# Patient Record
Sex: Female | Born: 1955 | Race: White | Hispanic: No | Marital: Married | Smoking: Current every day smoker
Health system: Southern US, Community
[De-identification: ages and names within clinical notes are randomized; demographics above are authoritative.]

## PROBLEM LIST (undated history)

## (undated) HISTORY — PX: ABDOMINAL HYSTERECTOMY: SHX81

---

## 2002-11-14 ENCOUNTER — Other Ambulatory Visit: Admission: RE | Admit: 2002-11-14 | Discharge: 2002-11-14 | Payer: Self-pay | Admitting: Family Medicine

## 2004-06-30 ENCOUNTER — Ambulatory Visit: Payer: Self-pay | Admitting: Internal Medicine

## 2006-06-22 ENCOUNTER — Ambulatory Visit: Payer: Self-pay | Admitting: Family Medicine

## 2007-02-01 ENCOUNTER — Encounter: Admission: RE | Admit: 2007-02-01 | Discharge: 2007-02-01 | Payer: Self-pay | Admitting: Family Medicine

## 2007-02-01 ENCOUNTER — Ambulatory Visit: Payer: Self-pay | Admitting: Family Medicine

## 2007-02-01 DIAGNOSIS — R079 Chest pain, unspecified: Secondary | ICD-10-CM | POA: Insufficient documentation

## 2007-02-02 ENCOUNTER — Telehealth: Payer: Self-pay | Admitting: Family Medicine

## 2007-02-02 DIAGNOSIS — R1011 Right upper quadrant pain: Secondary | ICD-10-CM | POA: Insufficient documentation

## 2007-02-06 ENCOUNTER — Telehealth (INDEPENDENT_AMBULATORY_CARE_PROVIDER_SITE_OTHER): Payer: Self-pay | Admitting: *Deleted

## 2007-02-06 ENCOUNTER — Encounter: Admission: RE | Admit: 2007-02-06 | Discharge: 2007-02-06 | Payer: Self-pay | Admitting: Family Medicine

## 2007-03-20 ENCOUNTER — Telehealth (INDEPENDENT_AMBULATORY_CARE_PROVIDER_SITE_OTHER): Payer: Self-pay | Admitting: *Deleted

## 2007-03-22 ENCOUNTER — Ambulatory Visit: Payer: Self-pay | Admitting: Family Medicine

## 2007-03-22 LAB — CONVERTED CEMR LAB
Bilirubin Urine: NEGATIVE
Glucose, Urine, Semiquant: NEGATIVE
Nitrite: NEGATIVE
Protein, U semiquant: NEGATIVE
WBC Urine, dipstick: NEGATIVE

## 2007-03-23 ENCOUNTER — Encounter (INDEPENDENT_AMBULATORY_CARE_PROVIDER_SITE_OTHER): Payer: Self-pay | Admitting: *Deleted

## 2007-03-23 LAB — CONVERTED CEMR LAB
ALT: 21 units/L (ref 0–35)
AST: 18 units/L (ref 0–37)
Albumin: 3.7 g/dL (ref 3.5–5.2)
Bilirubin, Direct: 0.1 mg/dL (ref 0.0–0.3)
CO2: 30 meq/L (ref 19–32)
Cholesterol: 164 mg/dL (ref 0–200)
Creatinine, Ser: 0.8 mg/dL (ref 0.4–1.2)
Direct LDL: 107.2 mg/dL
Eosinophils Relative: 3.4 % (ref 0.0–5.0)
GFR calc Af Amer: 98 mL/min
Glucose, Bld: 76 mg/dL (ref 70–99)
HCT: 41.3 % (ref 36.0–46.0)
HDL: 31.9 mg/dL — ABNORMAL LOW (ref >39.0)
Neutrophils Relative %: 66.1 % (ref 43.0–77.0)
Potassium: 4.2 meq/L (ref 3.5–5.1)
RBC: 4.39 M/uL (ref 3.87–5.11)
RDW: 11.8 % (ref 11.5–14.6)
Sodium: 143 meq/L (ref 135–145)
WBC: 9.3 10*3/uL (ref 4.5–10.5)

## 2007-03-26 ENCOUNTER — Ambulatory Visit (HOSPITAL_COMMUNITY): Admission: RE | Admit: 2007-03-26 | Discharge: 2007-03-26 | Payer: Self-pay | Admitting: Family Medicine

## 2007-03-28 ENCOUNTER — Telehealth: Payer: Self-pay | Admitting: Family Medicine

## 2007-04-04 ENCOUNTER — Telehealth: Payer: Self-pay | Admitting: Family Medicine

## 2007-04-12 ENCOUNTER — Encounter: Payer: Self-pay | Admitting: Family Medicine

## 2007-04-26 ENCOUNTER — Encounter: Payer: Self-pay | Admitting: Family Medicine

## 2008-06-11 ENCOUNTER — Ambulatory Visit: Payer: Self-pay | Admitting: Internal Medicine

## 2008-06-11 ENCOUNTER — Encounter (INDEPENDENT_AMBULATORY_CARE_PROVIDER_SITE_OTHER): Payer: Self-pay | Admitting: *Deleted

## 2008-06-11 DIAGNOSIS — J069 Acute upper respiratory infection, unspecified: Secondary | ICD-10-CM | POA: Insufficient documentation

## 2008-07-13 IMAGING — CR DG RIBS 2V*R*
5 series · 5 of 5 positions shown · non-contrast
Comparison: Chest x-ray 02/01/07.

CLINICAL DATA: Heavy lifting with persistent mid anterolateral right rib pain on inspiration. 
 DIAGNOSTIC RIBS UNILATERAL RIGHT ? 5 VIEW:

[view not recorded (1 of 5)]
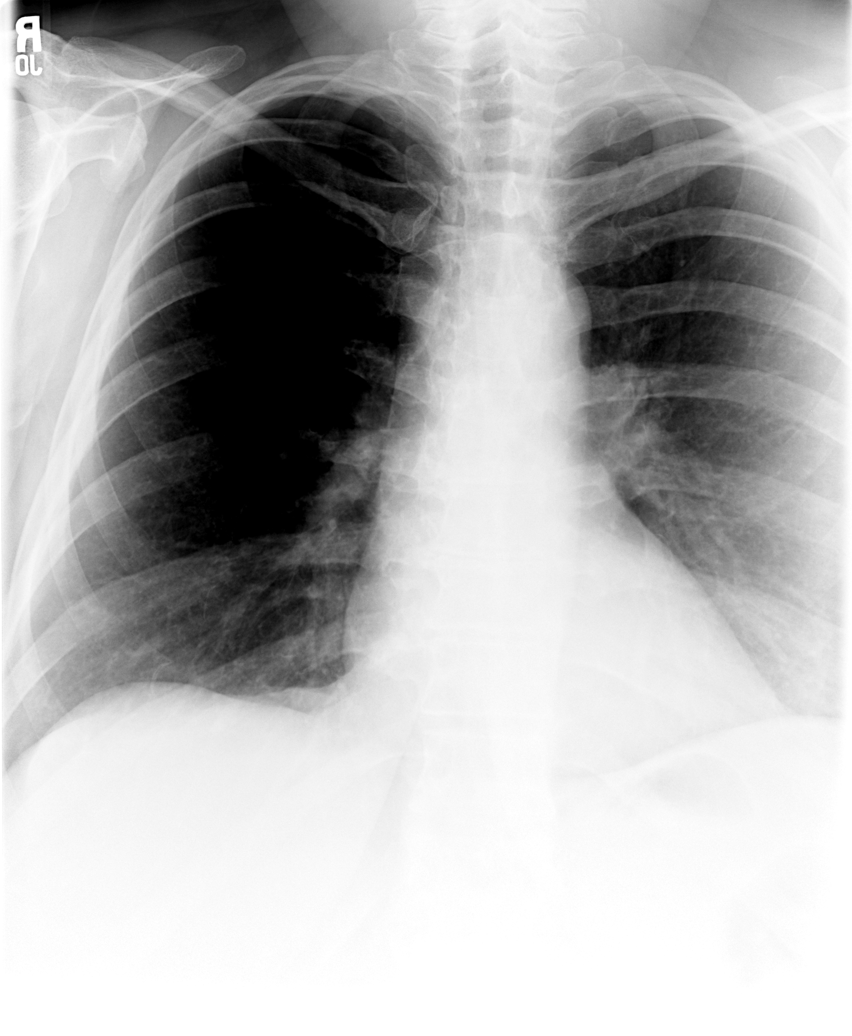

[view not recorded (2 of 5)]
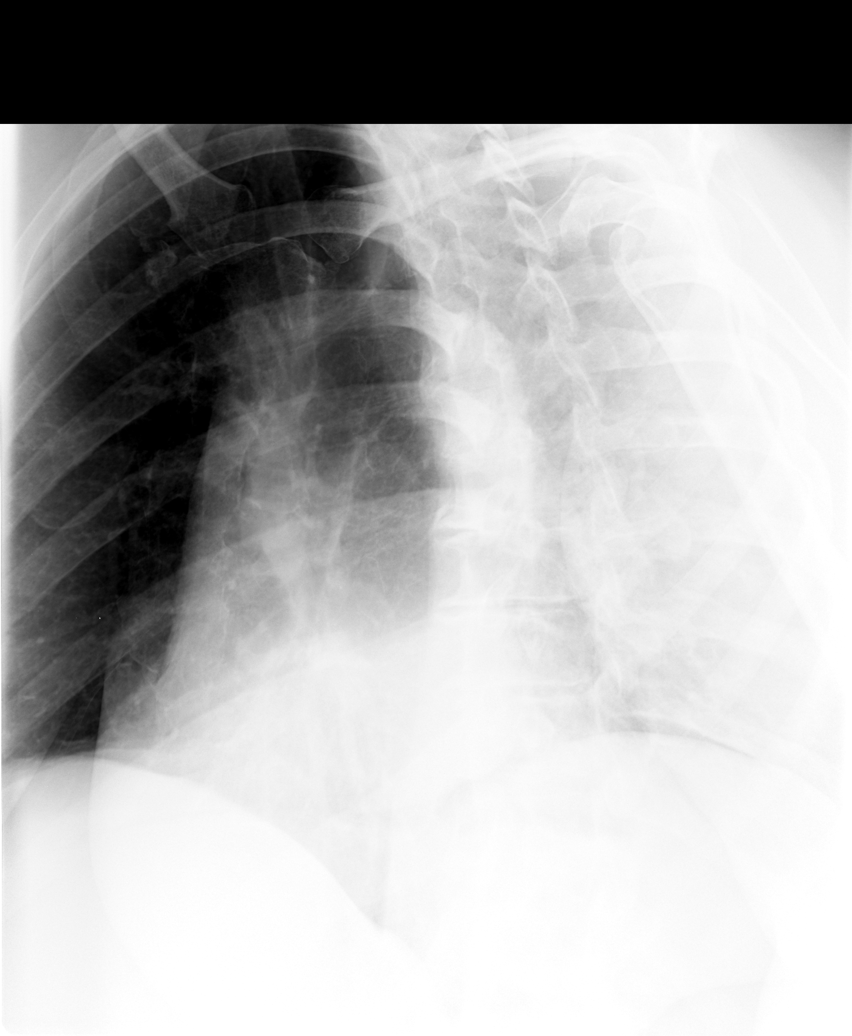

[view not recorded (3 of 5)]
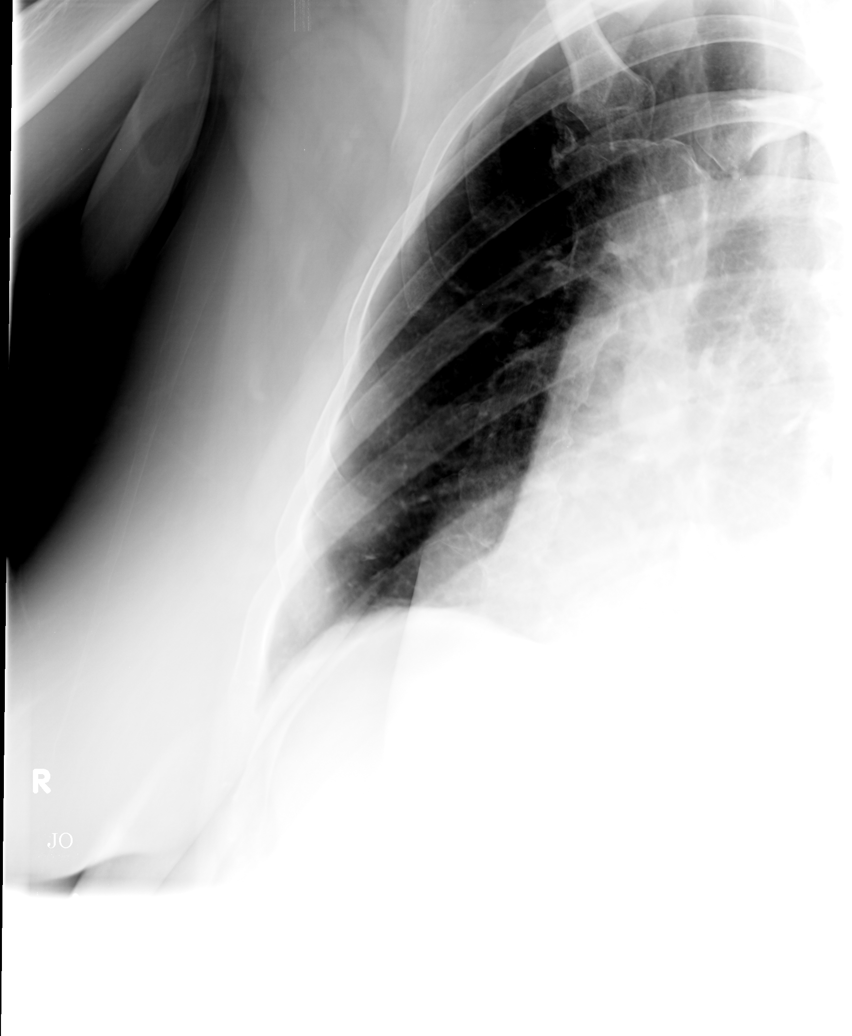

[view not recorded (4 of 5)]
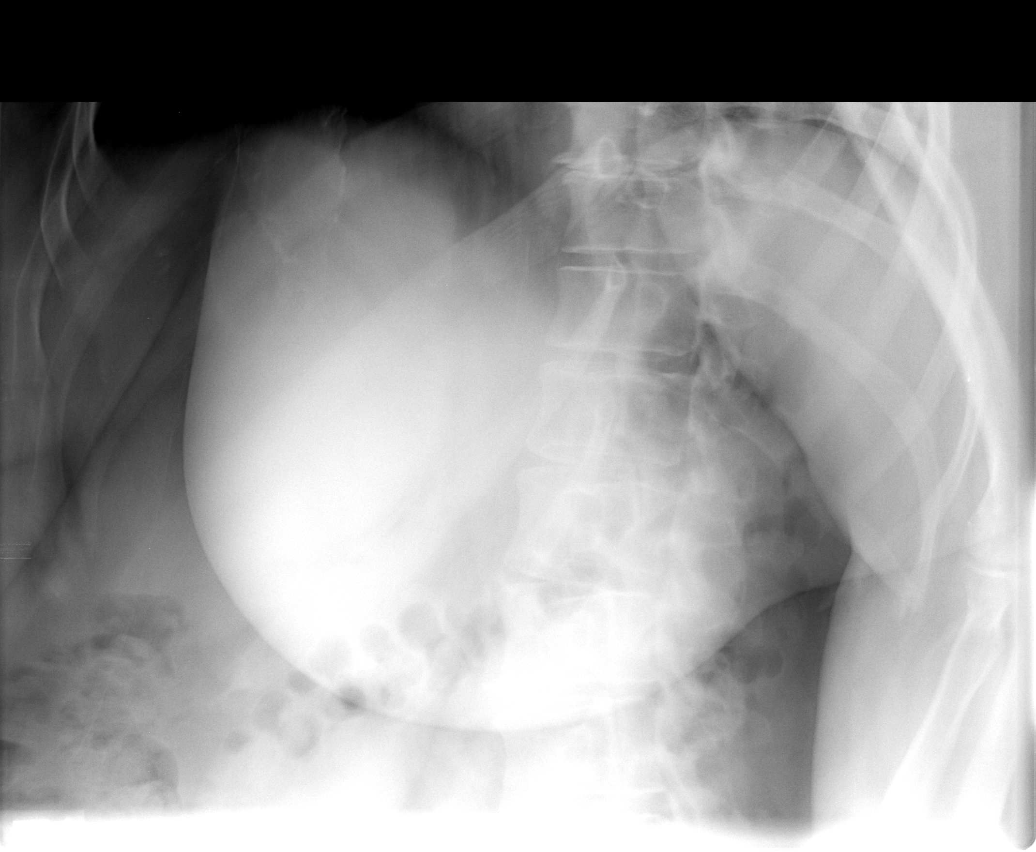

[view not recorded (5 of 5)]
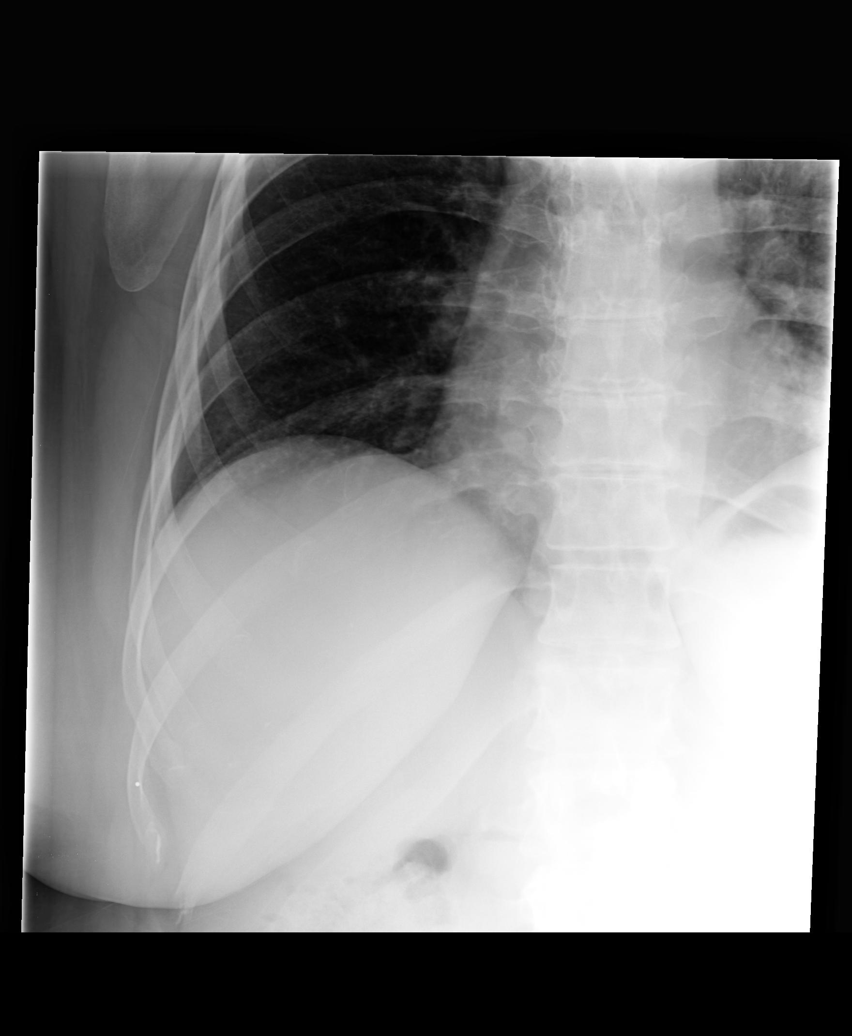

[5 of 5 positions shown; findings below may reference images not displayed]

FINDINGS: No fracture or other bone lesions are seen involving the ribs.  Region of maximum symptoms as indicated by patient marked with surface BB.
IMPRESSION: Negative.

## 2009-03-31 ENCOUNTER — Ambulatory Visit: Payer: Self-pay | Admitting: Family Medicine

## 2009-03-31 DIAGNOSIS — J019 Acute sinusitis, unspecified: Secondary | ICD-10-CM

## 2009-04-03 ENCOUNTER — Telehealth (INDEPENDENT_AMBULATORY_CARE_PROVIDER_SITE_OTHER): Payer: Self-pay | Admitting: *Deleted

## 2009-06-18 ENCOUNTER — Ambulatory Visit: Payer: Self-pay | Admitting: Family Medicine

## 2009-08-14 ENCOUNTER — Ambulatory Visit: Payer: Self-pay | Admitting: Family Medicine

## 2009-08-14 ENCOUNTER — Telehealth (INDEPENDENT_AMBULATORY_CARE_PROVIDER_SITE_OTHER): Payer: Self-pay | Admitting: *Deleted

## 2010-06-11 ENCOUNTER — Ambulatory Visit: Payer: Self-pay | Admitting: Family Medicine

## 2010-06-18 ENCOUNTER — Telehealth (INDEPENDENT_AMBULATORY_CARE_PROVIDER_SITE_OTHER): Payer: Self-pay | Admitting: *Deleted

## 2010-07-20 NOTE — Assessment & Plan Note (Signed)
Summary: SNEEZING,WATERY EYES, NOT FEELING BETTER/NTA   Vital Signs:  Patient profile:   55 year old female Weight:      302 pounds Temp:     98.4 degrees F oral Pulse rate:   84 / minute Pulse rhythm:   regular BP sitting:   112 / 80  (left arm) Cuff size:   large  Vitals Entered By: Army Fossa CMA (August 14, 2009 1:01 PM) CC: Pt c/o sneezing, head congestion, eyes watery, dull headache x 1 day., URI symptoms   History of Present Illness:       This is a 55 year old woman who presents with URI symptoms.  The symptoms began 3 days ago.  Pt is not taking any otc.  The patient complains of nasal congestion and clear nasal discharge, but denies purulent nasal discharge, sore throat, dry cough, productive cough, earache, and sick contacts.  The patient denies fever, low-grade fever (<100.5 degrees), fever of 100.5-103 degrees, fever of 103.1-104 degrees, fever to >104 degrees, stiff neck, dyspnea, wheezing, rash, vomiting, diarrhea, use of an antipyretic, and response to antipyretic.  The patient also reports itchy watery eyes, sneezing, and headache.  The patient denies the following risk factors for Strep sinusitis: unilateral facial pain, unilateral nasal discharge, poor response to decongestant, double sickening, tooth pain, Strep exposure, tender adenopathy, and absence of cough.    Current Medications (verified): 1)  Augmentin 875-125 Mg Tabs (Amoxicillin-Pot Clavulanate) .Marland Kitchen.. 1 By Mouth Two Times A Day 2)  Cheratussin Ac 100-10 Mg/58ml Syrp (Guaifenesin-Codeine) .Marland Kitchen.. 1-2 Tsp By Mouth At Bedtime 3)  Nasonex 50 Mcg/act Susp (Mometasone Furoate) .... 2 Sprays Each Nostril Once Daily 4)  Astepro 0.15 % Soln (Azelastine Hcl) .... 2 Sprays Each Nostril Once Daily  Allergies (verified): No Known Drug Allergies  Past History:  Past medical, surgical, family and social histories (including risk factors) reviewed for relevance to current acute and chronic problems.  Family  History: Reviewed history and no changes required.  Social History: Reviewed history and no changes required.  Review of Systems      See HPI  Physical Exam  General:  Well-developed,well-nourished,in no acute distress; alert,appropriate and cooperative throughout examination Ears:  External ear exam shows no significant lesions or deformities.  Otoscopic examination reveals clear canals, tympanic membranes are intact bilaterally without bulging, retraction, inflammation or discharge. Hearing is grossly normal bilaterally. Nose:  L frontal sinus tenderness, L maxillary sinus tenderness, R frontal sinus tenderness, and R maxillary sinus tenderness.   Mouth:  Oral mucosa and oropharynx without lesions or exudates.  Teeth in good repair. Neck:  No deformities, masses, or tenderness noted. Lungs:  Normal respiratory effort, chest expands symmetrically. Lungs are clear to auscultation, no crackles or wheezes. Heart:  normal rate and no murmur.   Cervical Nodes:  No lymphadenopathy noted Psych:  Cognition and judgment appear intact. Alert and cooperative with normal attention span and concentration. No apparent delusions, illusions, hallucinations   Impression & Recommendations:  Problem # 1:  SINUSITIS - ACUTE-NOS (ICD-461.9)  The following medications were removed from the medication list:    Cheratussin Ac 100-10 Mg/12ml Syrp (Guaifenesin-codeine) .Marland Kitchen... 1 -2 tsp by mouth at bedtime as needed Her updated medication list for this problem includes:    Augmentin 875-125 Mg Tabs (Amoxicillin-pot clavulanate) .Marland Kitchen... 1 by mouth two times a day    Cheratussin Ac 100-10 Mg/65ml Syrp (Guaifenesin-codeine) .Marland Kitchen... 1-2 tsp by mouth at bedtime    Nasonex 50 Mcg/act Susp (Mometasone furoate) .Marland KitchenMarland KitchenMarland KitchenMarland Kitchen  2 sprays each nostril once daily    Astepro 0.15 % Soln (Azelastine hcl) .Marland Kitchen... 2 sprays each nostril once daily  Instructed on treatment. Call if symptoms persist or worsen.   Complete Medication List: 1)   Augmentin 875-125 Mg Tabs (Amoxicillin-pot clavulanate) .Marland Kitchen.. 1 by mouth two times a day 2)  Cheratussin Ac 100-10 Mg/65ml Syrp (Guaifenesin-codeine) .Marland Kitchen.. 1-2 tsp by mouth at bedtime 3)  Nasonex 50 Mcg/act Susp (Mometasone furoate) .... 2 sprays each nostril once daily 4)  Astepro 0.15 % Soln (Azelastine hcl) .... 2 sprays each nostril once daily Prescriptions: ASTEPRO 0.15 % SOLN (AZELASTINE HCL) 2 sprays each nostril once daily  #1 x 0   Entered and Authorized by:   Loreen Freud DO   Signed by:   Loreen Freud DO on 08/14/2009   Method used:   Historical   RxID:   [id number] NASONEX 50 MCG/ACT SUSP (MOMETASONE FUROATE) 2 sprays each nostril once daily  #1 x 0   Entered and Authorized by:   Loreen Freud DO   Signed by:   Loreen Freud DO on 08/14/2009   Method used:   Historical   RxID:   [id number] CHERATUSSIN AC 100-10 MG/5ML SYRP (GUAIFENESIN-CODEINE) 1-2 tsp by mouth at bedtime  #6 oz x 0   Entered and Authorized by:   Loreen Freud DO   Signed by:   Loreen Freud DO on 08/14/2009   Method used:   Print then Give to Patient   RxID:   [id number] AUGMENTIN 875-125 MG TABS (AMOXICILLIN-POT CLAVULANATE) 1 by mouth two times a day  #20 x 0   Entered and Authorized by:   Loreen Freud DO   Signed by:   Loreen Freud DO on 08/14/2009   Method used:   Print then Give to Patient   RxID:   [id number]

## 2010-07-20 NOTE — Progress Notes (Signed)
Summary: Refill on Antibodic  Phone Note Call from Patient Call back at Work Phone [phone]   Caller: Patient Reason for Call: Refill Medication Summary of Call: Patient is still having the congestion going on. Sneezy, watery eyes,runny nose. She would like the same antibiotic she had last time. She uses [facility] on [address]. Initial call taken by: Harold Barban,  August 14, 2009 8:13 AM  Follow-up for Phone Call        Pt states that she has to have something for the weekend. I offered her appt at another office she will not go. Army Fossa CMA  August 14, 2009 10:28 AM   Additional Follow-up for Phone Call Additional follow up Details #1::        Per dr Laury Axon she can either come after lunch, and we would work her in and she would have to wait, or she could see another dr who has an opening, or she can go to saturday clinic tomorrow. Pt does not want to go to any other office besides this office. Pt states she is going to look elsewhere and hung up the phone. Army Fossa CMA  August 14, 2009 10:39 AM     Additional Follow-up for Phone Call Additional follow up Details #2::    Nikki--please call her---I'm not sure what the problem was-- we offered to see her but we were fiitting her in ---Duwayne Heck said she was upset she could not be seen right away.    Thanks  Yrlowne  08/14/2009  1155a  Additional Follow-up for Phone Call Additional follow up Details #3:: Details for Additional Follow-up Action Taken: Spoke with patient and she will be here at 12:45p Additional Follow-up by: Blain Pais 08-14-09 12:13p

## 2010-07-22 NOTE — Progress Notes (Signed)
Summary: Refill  Phone Note Call from Patient Call back at Work Phone [phone]   Caller: Patient Summary of Call: Pt is requesting another RX for antibiotics and the codiene syrup. Pt states that she was told to call back if she needed another round. Please advise.  Initial call taken by: Lavell Islam,  June 18, 2010 9:56 AM  Follow-up for Phone Call        ok for Augmentin x10 days and refill on cough syrup.  if worsening or no improvement needs CXR Follow-up by: Neena Rhymes MD,  June 18, 2010 10:13 AM  Additional Follow-up for Phone Call Additional follow up Details #1::        spoke w/ patient aware of instructions.Marland KitchenMarland KitchenMarland KitchenDoristine Devoid CMA  June 18, 2010 10:22 AM     New/Updated Medications: AUGMENTIN 875-125 MG TABS (AMOXICILLIN-POT CLAVULANATE) 1 by mouth 2 times daily Prescriptions: CHERATUSSIN AC 100-10 MG/5ML SYRP (GUAIFENESIN-CODEINE) 1-2 tsps Q4-6 as needed for cough.  will cause drowsiness  #150 x 0   Entered by:   Doristine Devoid CMA   Authorized by:   Neena Rhymes MD   Signed by:   Doristine Devoid CMA on 06/18/2010   Method used:   Printed then faxed to ...       [facility] [address] (retail)       [phone]       [place], [place]  [address]       Ph: [phone]       Fax: [phone]   RxID:   [id number] AUGMENTIN 875-125 MG TABS (AMOXICILLIN-POT CLAVULANATE) 1 by mouth 2 times daily  #20 x 3   Entered and Authorized by:   Neena Rhymes MD   Signed by:   Neena Rhymes MD on 06/18/2010   Method used:   Electronically to        [facility] [address] (retail)       [phone]       [place], [place]  [address]       Ph: [phone]       Fax: [phone]   RxID:   [id number]

## 2010-07-22 NOTE — Assessment & Plan Note (Signed)
Summary: COUGH,CONGESTION/RH.....   Vital Signs:  Patient profile:   55 year old female Weight:      282 pounds BMI:     43.03 O2 Sat:      95 % on Room air Temp:     98.0 degrees F oral Pulse rate:   86 / minute BP sitting:   130 / 72  (left arm)  Vitals Entered By: Doristine Devoid CMA (June 11, 2010 10:57 AM)  O2 Flow:  Room air CC: cough and chest congestion    History of Present Illness: 55 yo woman here today for cough.  'hacking cough'.  started Tuesday evening.  has had URI sxs since Thanksgiving.  having abd soreness from cough.  cough is nonproductive.  some associated diarrhea.  no fevers.  using Nasonex until running out of it.  no ear pain but some frontal sinus pain.  not able to sleep due to cough.  Current Medications (verified): 1)  Nasonex 50 Mcg/act Susp (Mometasone Furoate) .... 2 Sprays Each Nostril Once Daily 2)  Astepro 0.15 % Soln (Azelastine Hcl) .... 2 Sprays Each Nostril Once Daily  Allergies (verified): No Known Drug Allergies  Review of Systems      See HPI  Physical Exam  General:  Well-developed,well-nourished,in no acute distress; alert,appropriate and cooperative throughout examination Head:  NCAT, no TTP over sinuses Eyes:  no injxn or inflammation Ears:  External ear exam shows no significant lesions or deformities.  Otoscopic examination reveals clear canals, tympanic membranes are intact bilaterally without bulging, retraction, inflammation or discharge. Hearing is grossly normal bilaterally. Nose:  mild congestion Mouth:  Oral mucosa and oropharynx without lesions or exudates.  Teeth in good repair. Neck:  No deformities, masses, or tenderness noted. Lungs:  coarse BS LLL, ? crackles CTA on R   Impression & Recommendations:  Problem # 1:  BRONCHITIS- ACUTE (ICD-466.0) Assessment New ? bronchitis vs early PNA.  pt w/out insurance.  will hold on CXR.  start abx.  if no improvement or worsening will need CXR.  reviewed supportive  care and red flags that should prompt return.  Pt expresses understanding and is in agreement w/ this plan. The following medications were removed from the medication list:    Cheratussin Ac 100-10 Mg/63ml Syrp (Guaifenesin-codeine) .Marland Kitchen... 1-2 tsp by mouth at bedtime Her updated medication list for this problem includes:    Azithromycin 250 Mg Tabs (Azithromycin) .Marland Kitchen... 2 by  mouth today and then 1 daily for 4 days    Tessalon 200 Mg Caps (Benzonatate) .Marland Kitchen... Take one capsule by mouth three times a day as needed for cough    Cheratussin Ac 100-10 Mg/74ml Syrp (Guaifenesin-codeine) .Marland Kitchen... 1-2 tsps q4-6 as needed for cough.  will cause drowsiness  Complete Medication List: 1)  Nasonex 50 Mcg/act Susp (Mometasone furoate) .... 2 sprays each nostril once daily 2)  Astepro 0.15 % Soln (Azelastine hcl) .... 2 sprays each nostril once daily 3)  Azithromycin 250 Mg Tabs (Azithromycin) .... 2 by  mouth today and then 1 daily for 4 days 4)  Tessalon 200 Mg Caps (Benzonatate) .... Take one capsule by mouth three times a day as needed for cough 5)  Cheratussin Ac 100-10 Mg/64ml Syrp (Guaifenesin-codeine) .Marland Kitchen.. 1-2 tsps q4-6 as needed for cough.  will cause drowsiness  Patient Instructions: 1)  Take the Azithromycin as directed for the bronchitis 2)  Drink plenty of fluids 3)  Tylenol/Ibuprofen as needed for pain or fever 4)  Tessalon for daytime cough,  codeine syrup for night 5)  REST! 6)  Hang in there! 7)  Merry Christmas!!! Prescriptions: CHERATUSSIN AC 100-10 MG/5ML SYRP (GUAIFENESIN-CODEINE) 1-2 tsps Q4-6 as needed for cough.  will cause drowsiness  #150 x 0   Entered and Authorized by:   Neena Rhymes MD   Signed by:   Neena Rhymes MD on 06/11/2010   Method used:   Print then Give to Patient   RxID:   [id number] TESSALON 200 MG CAPS (BENZONATATE) Take one capsule by mouth three times a day as needed for cough  #60 x 0   Entered and Authorized by:   Neena Rhymes MD   Signed by:    Neena Rhymes MD on 06/11/2010   Method used:   Electronically to        [facility] [address] (retail)       [phone]       [place], [place]  [address]       Ph: [phone]       Fax: [phone]   RxID:   [id number] AZITHROMYCIN 250 MG  TABS (AZITHROMYCIN) 2 by  mouth today and then 1 daily for 4 days  #6 x 0   Entered and Authorized by:   Neena Rhymes MD   Signed by:   Neena Rhymes MD on 06/11/2010   Method used:   Electronically to        [facility] [address] (retail)       [phone]       [place], [place]  [address]       Ph: [phone]       Fax: [phone]   RxID:   [id number]    Orders Added: 1)  Est. Patient Level III [20254]

## 2015-08-06 ENCOUNTER — Emergency Department (INDEPENDENT_AMBULATORY_CARE_PROVIDER_SITE_OTHER)
Admission: EM | Admit: 2015-08-06 | Discharge: 2015-08-06 | Disposition: A | Discharge status: Home / Self Care | Source: Home / Self Care | Attending: Family Medicine | Admitting: Family Medicine

## 2015-08-06 ENCOUNTER — Encounter: Payer: Self-pay | Admitting: Emergency Medicine

## 2015-08-06 DIAGNOSIS — J069 Acute upper respiratory infection, unspecified: Secondary | ICD-10-CM | POA: Diagnosis not present

## 2015-08-06 MED ORDER — BENZONATATE 100 MG PO CAPS: 100.0000 mg | ORAL_CAPSULE | Freq: Three times a day (TID) | ORAL | Status: DC

## 2015-08-06 MED ORDER — AZITHROMYCIN 250 MG PO TABS: 250.0000 mg | ORAL_TABLET | Freq: Every day | ORAL | Status: DC

## 2015-08-06 NOTE — ED Notes (Signed)
Productive cough, congestion, sneezing x 2 weeks

## 2015-08-06 NOTE — ED Provider Notes (Signed)
CSN: [id number]     Arrival date & time 08/06/15  1439 History   First MD Initiated Contact with Patient 08/06/15 1513     Chief Complaint  Patient presents with  . Cough   (Consider location/radiation/quality/duration/timing/severity/associated sxs/prior Treatment) HPI  The pt is a 60yo female presenting to [facility] with c/o mild to moderately productive cough with congestion and sneezing for 2 weeks.  She has been taking OTC cough/cold medication with only temporary relief.  Others are also sick at work. Denies chest pain or SOB. Denies n/v/d.   History reviewed. No pertinent past medical history. Past Surgical History  Procedure Laterality Date  . Abdominal hysterectomy     Family History  Problem Relation Age of Onset  . Hypertension Mother   . COPD Mother   . Pulmonary fibrosis Mother   . Alzheimer's disease Father   . Parkinson's disease Father    Social History  Substance Use Topics  . Smoking status: Current Every Day Smoker -- 0.50 packs/day for 45 years    Types: Cigarettes  . Smokeless tobacco: None  . Alcohol Use: Yes   OB History    No data available     Review of Systems  Constitutional: Negative for fever and chills.  HENT: Positive for congestion, postnasal drip, rhinorrhea and sneezing. Negative for ear pain, sore throat, trouble swallowing and voice change.   Respiratory: Positive for cough and wheezing. Negative for shortness of breath.   Cardiovascular: Negative for chest pain and palpitations.  Gastrointestinal: Negative for nausea, vomiting, abdominal pain and diarrhea.  Musculoskeletal: Negative for myalgias, back pain and arthralgias.  Skin: Negative for rash.    Allergies  Review of patient's allergies indicates no known allergies.  Home Medications   Prior to Admission medications   Medication Sig Start Date End Date Taking? Authorizing Provider  ibuprofen (ADVIL,MOTRIN) 800 MG tablet Take 800 mg by mouth every 8 (eight) hours as needed.   Yes  Historical Provider, MD  Pseudoeph-Doxylamine-DM-APAP (DAYQUIL/NYQUIL COLD/FLU RELIEF PO) Take by mouth.   Yes Historical Provider, MD  azithromycin (ZITHROMAX) 250 MG tablet Take 1 tablet (250 mg total) by mouth daily. Take first 2 tablets together, then 1 every day until finished. 08/06/15   Junius Finner, PA-C  benzonatate (TESSALON) 100 MG capsule Take 1 capsule (100 mg total) by mouth every 8 (eight) hours. 08/06/15   Junius Finner, PA-C   Meds Ordered and Administered this Visit  Medications - No data to display  BP 123/85 mmHg  Pulse 80  Temp(Src) 98.3 F (36.8 C) (Oral)  Ht  (1.727 m)  Wt 286 lb (129.729 kg)  BMI 43.50 kg/m2  SpO2 97% No data found.   Physical Exam  Constitutional: She appears well-developed and well-nourished. No distress.  HENT:  Head: Normocephalic and atraumatic.  Right Ear: Tympanic membrane normal.  Left Ear: Tympanic membrane normal.  Nose: Rhinorrhea present. Right sinus exhibits no maxillary sinus tenderness and no frontal sinus tenderness. Left sinus exhibits no maxillary sinus tenderness and no frontal sinus tenderness.  Mouth/Throat: Uvula is midline, oropharynx is clear and moist and mucous membranes are normal.  Eyes: Conjunctivae are normal. No scleral icterus.  Neck: Normal range of motion. Neck supple.  Cardiovascular: Normal rate, regular rhythm and normal heart sounds.   Pulmonary/Chest: Effort normal and breath sounds normal. No stridor. No respiratory distress. She has no wheezes. She has no rales.  Mildly productive cough on exam. Lungs: CTAB. No respiratory distress.  Abdominal: Soft. She exhibits  no distension. There is no tenderness.  Musculoskeletal: Normal range of motion.  Lymphadenopathy:    She has no cervical adenopathy.  Neurological: She is alert.  Skin: Skin is warm and dry. She is not diaphoretic.  Nursing note and vitals reviewed.   ED Course  Procedures (including critical care time)  Labs Review Labs  Reviewed - No data to display  Imaging Review No results found.     MDM   1. Acute upper respiratory infection    Pt c/o 2 weeks of persistent URI symptoms.  Pt is afebrile. Lungs: CTAB but mildly productive cough on exam.  Will prescribe azithromycin to cover for atypical bacteria Rx: azithromycin and tessalon  Advised pt to use acetaminophen and ibuprofen as needed for fever and pain. Encouraged rest and fluids. F/u with PCP in 1 week if not improving, sooner if worsening. Pt verbalized understanding and agreement with tx plan.    Junius Finner, PA-C 08/06/15 [phone]

## 2015-08-06 NOTE — Discharge Instructions (Signed)
You may take 400-600mg Ibuprofen (Motrin) every 6-8 hours for fever and pain  °Alternate with Tylenol  °You may take 500mg Tylenol every 4-6 hours as needed for fever and pain  °Follow-up with your primary care provider next week for recheck of symptoms if not improving.  °Be sure to drink plenty of fluids and rest, at least 8hrs of sleep a night, preferably more while you are sick. °Return urgent care or go to closest ER if you cannot keep down fluids/signs of dehydration, fever not reducing with Tylenol, difficulty breathing/wheezing, stiff neck, worsening condition, or other concerns (see below)  °Please take antibiotics as prescribed and be sure to complete entire course even if you start to feel better to ensure infection does not come back. ° ° °Cool Mist Vaporizers °Vaporizers may help relieve the symptoms of a cough and cold. They add moisture to the air, which helps mucus to become thinner and less sticky. This makes it easier to breathe and cough up secretions. Cool mist vaporizers do not cause serious burns like hot mist vaporizers, which may also be called steamers or humidifiers. Vaporizers have not been proven to help with colds. You should not use a vaporizer if you are allergic to mold. °HOME CARE INSTRUCTIONS °· Follow the package instructions for the vaporizer. °· Do not use anything other than distilled water in the vaporizer. °· Do not run the vaporizer all of the time. This can cause mold or bacteria to grow in the vaporizer. °· Clean the vaporizer after each time it is used. °· Clean and dry the vaporizer well before storing it. °· Stop using the vaporizer if worsening respiratory symptoms develop. °  °This information is not intended to replace advice given to you by your health care provider. Make sure you discuss any questions you have with your health care provider. °  °Document Released: 03/03/2004 Document Revised: 06/11/2013 Document Reviewed: 10/24/2012 °Elsevier Interactive Patient  Education ©2016 [employer] ° °Upper Respiratory Infection, Adult °Most upper respiratory infections (URIs) are caused by a virus. A URI affects the nose, throat, and upper air passages. The most common type of URI is often called "the common cold." °HOME CARE  °· Take medicines only as told by your doctor. °· Gargle warm saltwater or take cough drops to comfort your throat as told by your doctor. °· Use a warm mist humidifier or inhale steam from a shower to increase air moisture. This may make it easier to breathe. °· Drink enough fluid to keep your pee (urine) clear or pale yellow. °· Eat soups and other clear broths. °· Have a healthy diet. °· Rest as needed. °· Go back to work when your fever is gone or your doctor says it is okay. °¨ You may need to stay home longer to avoid giving your URI to others. °¨ You can also wear a face mask and wash your hands often to prevent spread of the virus. °· Use your inhaler more if you have asthma. °· Do not use any tobacco products, including cigarettes, chewing tobacco, or electronic cigarettes. If you need help quitting, ask your doctor. °GET HELP IF: °· You are getting worse, not better. °· Your symptoms are not helped by medicine. °· You have chills. °· You are getting more short of breath. °· You have brown or red mucus. °· You have yellow or brown discharge from your nose. °· You have pain in your face, especially when you bend forward. °· You have a fever. °·   You have puffy (swollen) neck glands. °· You have pain while swallowing. °· You have white areas in the back of your throat. °GET HELP RIGHT AWAY IF:  °· You have very bad or constant: °¨ Headache. °¨ Ear pain. °¨ Pain in your forehead, behind your eyes, and over your cheekbones (sinus pain). °¨ Chest pain. °· You have long-lasting (chronic) lung disease and any of the following: °¨ Wheezing. °¨ Long-lasting cough. °¨ Coughing up blood. °¨ A change in your usual mucus. °· You have a stiff neck. °· You have  changes in your: °¨ Vision. °¨ Hearing. °¨ Thinking. °¨ Mood. °MAKE SURE YOU:  °· Understand these instructions. °· Will watch your condition. °· Will get help right away if you are not doing well or get worse. °  °This information is not intended to replace advice given to you by your health care provider. Make sure you discuss any questions you have with your health care provider. °  °Document Released: 11/23/2007 Document Revised: 10/21/2014 Document Reviewed: 09/11/2013 °Elsevier Interactive Patient Education ©2016 [employer] Inc. ° °

## 2015-11-04 ENCOUNTER — Emergency Department (INDEPENDENT_AMBULATORY_CARE_PROVIDER_SITE_OTHER)
Admission: EM | Admit: 2015-11-04 | Discharge: 2015-11-04 | Disposition: A | Discharge status: Home / Self Care | Source: Home / Self Care | Attending: Family Medicine | Admitting: Family Medicine

## 2015-11-04 ENCOUNTER — Other Ambulatory Visit: Payer: Self-pay | Admitting: Family Medicine

## 2015-11-04 ENCOUNTER — Encounter: Payer: Self-pay | Admitting: Emergency Medicine

## 2015-11-04 DIAGNOSIS — D692 Other nonthrombocytopenic purpura: Secondary | ICD-10-CM | POA: Diagnosis not present

## 2015-11-04 LAB — POCT CBC W AUTO DIFF (K'VILLE URGENT CARE)

## 2015-11-04 NOTE — ED Notes (Signed)
Red, raised rash on lower right leg x 2 weeks, denies pain slight itch a couple of days ago, started Losartan 3 weeks ago

## 2015-11-04 NOTE — Discharge Instructions (Signed)
Follow up with family doctor.

## 2015-11-04 NOTE — ED Provider Notes (Signed)
CSN: [id number]     Arrival date & time 11/04/15  1317 History   First MD Initiated Contact with Patient 11/04/15 1318     Chief Complaint  Patient presents with  . Rash      HPI Comments: Patient reports that she noticed a "spot" on her right lower leg above ankle about 2 weeks ago.  The rash has slowly expanded in size, especially over the past several days.  Last night her husband noticed two smaller lesions on her left posterior calf.  The lesions are not painful and do not itch or burn.  The lesions have not responded to application of 1% HC cream.  She feels well otherwise.  She notes that she began taking Losartan for hypertension on April 27. Review of records indicate that patient had a negative chest pain evaluation 10/14/15 at a local [facility] ER.  Her CBC was normal except for elevated Hgb 15.3. Her Hgb A1C was 5.0.  Her TSH was slightly elevated at 4.55.  Her CMP showed normal renal function and mild hypokalemia 3.5.  A D-dimer was negative.  Patient is a 60 y.o. female presenting with rash. The history is provided by the patient.  Rash Location:  Leg Leg rash location:  R lower leg Quality: dryness and redness   Quality: not blistering, not bruising, not burning, not draining, not itchy, not painful, not peeling, not scaling, not swelling and not weeping   Severity:  Mild Onset quality:  Gradual Duration:  2 weeks Timing:  Constant Progression:  Worsening Chronicity:  New Context: medications   Context: not animal contact, not chemical exposure, not exposure to similar rash, not food, not hot tub use, not insect bite/sting, not new detergent/soap, not nuts, not plant contact and not sick contacts   Relieved by:  Nothing Worsened by:  Nothing tried Ineffective treatments:  Topical steroids Associated symptoms: no abdominal pain, no diarrhea, no fatigue, no fever, no headaches, no induration, no joint pain, no myalgias, no nausea, no shortness of breath, no sore throat,  no URI and not vomiting     History reviewed. No pertinent past medical history. Past Surgical History  Procedure Laterality Date  . Abdominal hysterectomy     Family History  Problem Relation Age of Onset  . Hypertension Mother   . COPD Mother   . Pulmonary fibrosis Mother   . Alzheimer's disease Father   . Parkinson's disease Father    Social History  Substance Use Topics  . Smoking status: Current Every Day Smoker -- 0.50 packs/day for 45 years    Types: Cigarettes  . Smokeless tobacco: None  . Alcohol Use: Yes   OB History    No data available     Review of Systems  Constitutional: Negative for fever and fatigue.  HENT: Negative for sore throat.   Respiratory: Negative for shortness of breath.   Gastrointestinal: Negative for nausea, vomiting, abdominal pain and diarrhea.  Musculoskeletal: Negative for myalgias and arthralgias.  Skin: Positive for rash.  Neurological: Negative for headaches.  All other systems reviewed and are negative.   Allergies  Review of patient's allergies indicates no known allergies.  Home Medications   Prior to Admission medications   Medication Sig Start Date End Date Taking? Authorizing Provider  losartan (COZAAR) 50 MG tablet Take 50 mg by mouth daily.   Yes Historical Provider, MD  ibuprofen (ADVIL,MOTRIN) 800 MG tablet Take 800 mg by mouth every 8 (eight) hours as needed.  Historical Provider, MD   Meds Ordered and Administered this Visit  Medications - No data to display  BP 112/80 mmHg  Pulse 87  Temp(Src) 98 F (36.7 C) (Oral)  Ht 5' 8"  (1.727 m)  Wt 313 lb (141.976 kg)  BMI 47.60 kg/m2  SpO2 98% No data found.   Physical Exam  Constitutional: She is oriented to person, place, and time. She appears well-developed and well-nourished. No distress.  Patient is obese (BMI 47.6)  HENT:  Head: Normocephalic.  Nose: Nose normal.  Mouth/Throat: Oropharynx is clear and moist.  Eyes: Conjunctivae and EOM are normal.  Pupils are equal, round, and reactive to light.  Neck: Neck supple.  Cardiovascular: Normal heart sounds.   Pulmonary/Chest: Breath sounds normal.  Abdominal: There is no tenderness.  Musculoskeletal: She exhibits no edema or tenderness.  Lymphadenopathy:    She has no cervical adenopathy.  Neurological: She is alert and oriented to person, place, and time.  Skin: Purpura and rash noted.     Right lower leg above ankle has a patch of slightly raised erythema about 4cm by 6cm as noted on diagram and photo.  The area is slightly scaly, and does not blanch.  The posterior calf has 2 smaller nummular lesions, also slightly raised and non blanching.  No induration or tenderness to palpation.    Nursing note and vitals reviewed.      ED Course  Procedures none    Labs Reviewed  COMPLETE METABOLIC PANEL WITH GFR  SEDIMENTATION RATE  C-REACTIVE PROTEIN  URINALYSIS W MICROSCOPIC (NOT AT [facility])  POCT CBC W AUTO DIFF ([facility]):  WBC 8.3; LY 27.8; MO 5.4; GR 66.8; Hgb 15.2; Platelets 291      MDM   1. Palpable purpura (HCC)     Suspect a vasculits ?etiology: ?drug induced Screening CBC, CMP, ESR, C-reactive protein; urinalysis with sediment exam. Followup with PCP for further evaluation/management.  May need biopsy.    Kandra Nicolas, MD 11/04/15 [phone]

## 2015-11-05 ENCOUNTER — Telehealth: Payer: Self-pay | Admitting: *Deleted

## 2015-11-05 LAB — COMPLETE METABOLIC PANEL WITH GFR
ALT: 29 U/L (ref 6–29)
AST: 25 U/L (ref 10–35)
Albumin: 4.5 g/dL (ref 3.6–5.1)
Alkaline Phosphatase: 55 U/L (ref 33–130)
BUN: 13 mg/dL (ref 7–25)
CHLORIDE: 103 mmol/L (ref 98–110)
CO2: 26 mmol/L (ref 20–31)
Calcium: 9.5 mg/dL (ref 8.6–10.4)
Creat: 0.98 mg/dL (ref 0.50–1.05)
GFR, EST AFRICAN AMERICAN: 74 mL/min (ref >=60)
GFR, EST NON AFRICAN AMERICAN: 64 mL/min (ref >=60)
GLUCOSE: 83 mg/dL (ref 65–99)
Potassium: 3.9 mmol/L (ref 3.5–5.3)
SODIUM: 139 mmol/L (ref 135–146)
Total Bilirubin: 0.5 mg/dL (ref 0.2–1.2)
Total Protein: 7.2 g/dL (ref 6.1–8.1)

## 2015-11-05 LAB — SEDIMENTATION RATE

## 2015-11-05 LAB — URINALYSIS, ROUTINE W REFLEX MICROSCOPIC
Bilirubin Urine: NEGATIVE
Glucose, UA: NEGATIVE
Hgb urine dipstick: NEGATIVE
Ketones, ur: NEGATIVE
LEUKOCYTES UA: NEGATIVE
NITRITE: NEGATIVE
PROTEIN: NEGATIVE
SPECIFIC GRAVITY, URINE: 1.02 (ref 1.001–1.035)
pH: 6 (ref 5.0–8.0)

## 2015-11-05 LAB — C-REACTIVE PROTEIN

## 2015-11-05 NOTE — ED Notes (Signed)
Callback: Pt advised of lab results. Per Dr. Cathren HarshBeese, schedule apt to establish care with PCPs next door, given their card yesterday, area may need to be biopsied. Skin eruption could also potentially be from recent new medication.
